# Patient Record
Sex: Male | Born: 1995 | Race: Black or African American | Hispanic: No | Marital: Single | Smoking: Never smoker
Health system: Southern US, Community
[De-identification: ages and names within clinical notes are randomized; demographics above are authoritative.]

## PROBLEM LIST (undated history)

## (undated) HISTORY — PX: NO PAST SURGERIES: SHX2092

---
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
## 2015-09-11 ENCOUNTER — Encounter: Payer: Self-pay | Admitting: Physical Therapy

## 2015-09-11 ENCOUNTER — Ambulatory Visit: Payer: BLUE CROSS/BLUE SHIELD | Attending: Family Medicine | Admitting: Physical Therapy

## 2015-09-11 DIAGNOSIS — M25552 Pain in left hip: Secondary | ICD-10-CM | POA: Diagnosis not present

## 2015-09-11 DIAGNOSIS — R262 Difficulty in walking, not elsewhere classified: Secondary | ICD-10-CM | POA: Insufficient documentation

## 2015-09-11 NOTE — Therapy (Signed)
[place] [facility] [address] [place], [place], [address] Phone: [phone]   Fax:  [phone]  Physical Therapy Evaluation  Patient Details  Name: Ryan Briggs MRN: [id number] Date of Birth: 1996-04-27 Referring Provider: Doristine MangoElizabeth White, NP  Encounter Date: 09/11/2015      PT End of Session - 09/11/15 1511    Visit Number 1   Number of Visits 6   Date for PT Re-Evaluation 09/25/15   PT Start Time 0943   PT Stop Time 1040   PT Time Calculation (min) 57 min   Activity Tolerance Patient tolerated treatment well;No increased pain   Behavior During Therapy [facility] for tasks assessed/performed      History reviewed. No pertinent past medical history.  History reviewed. No pertinent past surgical history.  There were no vitals filed for this visit.  Visit Diagnosis:  Pain in joint, pelvic region and thigh, left  Difficulty walking      Subjective Assessment - 09/11/15 1504    Subjective Pt reports L thigh pain with no mechanism of injury. Pt reports injury happening two months ago and that it has limited his basketball playing. Pt reports pain at rest at a 2/10 and while working at a 7/10. Pt states that he mostly has pain while working and once he stretches, it is better. Pt reports tightness around his knee but denies any joint line pain. Pt reports feeling pain with squatting.   Limitations Standing;Walking;Lifting   How long can you stand comfortably? 2 hours   How long can you walk comfortably? 1 hour   Diagnostic tests XRAYS   Patient Stated Goals return to work pain free/play basketball/squat without pain   Currently in Pain? Yes   Pain Score 2    Pain Location Leg   Pain Orientation Left   Pain Onset More than a month ago   Pain Frequency Intermittent            [facility] PT Assessment - 09/11/15 0001    Assessment   Medical Diagnosis L thigh pain   Referring Provider Doristine MangoElizabeth White, NP   Onset Date/Surgical  Date 06/15/15   Prior Therapy low back in middle school         There ex: Quad stretching 30 seconds x 3 holds. Pt reports relief of symptoms with this. Foam roller for L quad trigger point (good relief noted). Squatting to demonstrate proper form. Decreased pain with squatting after 3 rounds of lateral grade III patellar mobilizations. Pt educated on self patellar mobilizations.  See HEP handouts.         PT Education - 09/11/15 1509    Education provided Yes   Education Details Pt given stretching and foam roller to help decrease L thigh pain.   Person(s) Educated Patient   Methods Explanation;Demonstration;Handout   Comprehension Returned demonstration;Verbalized understanding             PT Long Term Goals - 09/11/15 1602    PT LONG TERM GOAL #1   Title Pt will improve LEFS to > 70/80 in order to stand at work for longer than one hour without increased pain.   Baseline 60/80 on 11/28   Time 3   Period Weeks   Status New   PT LONG TERM GOAL #2   Title Pt will return to playing basketball twice a week without increased complaints of L thigh pain.   Time 3   Period Weeks   Status New  PT LONG TERM GOAL #3   Title Pt will be independent with HEP in order to return to gym based workout routine.   Time 3   Period Weeks   Status New   PT LONG TERM GOAL #4   Title Pt will report walking tolerance for 2 hours in order to perform all work demands without increased complaints of pain.   Time 3   Period Weeks   Status New               Plan - 09/11/15 1511    Clinical Impression Statement Pt is a pleasant 19 y.o. referred to PT for L thigh pain with gradual onset over the past 2 months. Pt reports pain at rest at a 2/10 and while working at a 7/10. Pt's MMT B LEs is 5/5 with L knee extension pain. Pt's AROM is WNL and pain free. Pt has no quad leg present with SLR or quad set. Pt is (-) SLR for hamstring involvement. Pt's patellar mobility is hypermobile L as  compared to R side. Pt has increased symptoms with lateral patellar mobilizations but with repeated repetitions finds relief. The foam roller provides the most relief to L palpable trigger point in distal quad. Pt' LEFS: 60/80. Pt will benefit from short term skilled PT to address functional deficits and return to prior level of function.    Pt will benefit from skilled therapeutic intervention in order to improve on the following deficits Abnormal gait;Decreased activity tolerance;Decreased endurance;Hypermobility;Pain;Improper body mechanics;Difficulty walking   Rehab Potential Excellent   PT Frequency 2x / week   PT Duration 3 weeks   PT Treatment/Interventions ADLs/Self Care Home Management;Cryotherapy;Moist Heat;Balance training;Therapeutic exercise;Therapeutic activities;Manual techniques;Dry needling;Functional mobility training;Stair training;Gait training;Neuromuscular re-education;Patient/family education;Energy conservation   PT Next Visit Plan potentially dry needling/progress quad strengthening/squatting pain free   PT Home Exercise Plan see above   Consulted and Agree with Plan of Care Patient         Problem List There are no active problems to display for this patient.   Curlene Dolphin Higinio Grow,SPT  09/11/2015, 4:04 PM  [place] [facility] [address] [place], [place], [address] Phone: [phone]   Fax:  [phone]  Name: Ryan Briggs MRN: [id number] Date of Birth: [birth year]

## 2015-09-13 ENCOUNTER — Ambulatory Visit: Payer: BLUE CROSS/BLUE SHIELD | Admitting: Physical Therapy

## 2015-09-13 DIAGNOSIS — M25552 Pain in left hip: Secondary | ICD-10-CM

## 2015-09-13 DIAGNOSIS — R262 Difficulty in walking, not elsewhere classified: Secondary | ICD-10-CM

## 2015-09-13 NOTE — Therapy (Signed)
[place] [facility] [place] [address] [place], [place], [address] Phone: [phone]   Fax:  [phone]  Physical Therapy Treatment  Patient Details  Name: Ryan Briggs MRN: [id number] Date of Birth: Jun 24, 1996 Referring Provider: Doristine Mango, NP  Encounter Date: 09/13/2015      PT End of Session - 09/13/15 1231    Visit Number 2   Number of Visits 6   Date for PT Re-Evaluation 09/25/15   PT Start Time 1020   PT Stop Time 1105   PT Time Calculation (min) 45 min   Activity Tolerance Patient tolerated treatment well;No increased pain   Behavior During Therapy [facility] for tasks assessed/performed      No past medical history on file.  No past surgical history on file.  There were no vitals filed for this visit.  Visit Diagnosis:  Pain in joint, pelvic region and thigh, left  Difficulty walking      Subjective Assessment - 09/13/15 1229    Subjective Pt reports thigh feeling better but still having tightness behind his medial knee. Pt states he had pain after working yesterday for 45 mins that reached a 7/10. Pt reports after comimg home, his pain decreased to a 2/10.   Limitations Standing;Walking;Lifting   How long can you stand comfortably? 2 hours   How long can you walk comfortably? 1 hour   Diagnostic tests XRAYS   Patient Stated Goals return to work pain free/play basketball/squat without pain   Currently in Pain? No/denies      OBJECTIVE: Manual: L hamstring stretching in supine proximal and distal (increased complaints of tenderness and tightness with distal hamstring) 30 seconds x 4 each position. L distal quad STM to reassess tightness from last session. No palpable tenderness noted. In prone, strumming to L distal hamstring. Good relief noted and release of palpable trigger points. Pt reports feeling lose after strumming.There ex: Forwards walking lunges with the Nautilus 110# x 45. Good technique and no complaints of  increased symptoms. Side shuffle/sideways squat walking with 110# of Nautilus x 15 each direction. No reports of increased pain or symptoms. Step ups onto 12" step with knee hoovers and 5# dumbbells x 20.   Pt response to tx for medical necessity: Pt benefits from STM to maintain flexibility and then strengthen in a pain free range of motion.      PT Long Term Goals - 09/11/15 1602    PT LONG TERM GOAL #1   Title Pt will improve LEFS to > 70/80 in order to stand at work for longer than one hour without increased pain.   Baseline 60/80 on 11/28   Time 3   Period Weeks   Status New   PT LONG TERM GOAL #2   Title Pt will return to playing basketball twice a week without increased complaints of L thigh pain.   Time 3   Period Weeks   Status New   PT LONG TERM GOAL #3   Title Pt will be independent with HEP in order to return to gym based workout routine.   Time 3   Period Weeks   Status New   PT LONG TERM GOAL #4   Title Pt will report walking tolerance for 2 hours in order to perform all work demands without increased complaints of pain.   Time 3   Period Weeks   Status New               Plan -  09/13/15 1234    Clinical Impression Statement Pt has no tenderness to distal quad palpation and reports that stretching is making a difference. Pt is tender to palpation in distal vastus medialis in and right above the popliteal fossa. Pt finds relief with deep tissue strumming and is then able to perform strengthening. After strenghtening, pt has no increased tightness or complaints of pain.   Pt will benefit from skilled therapeutic intervention in order to improve on the following deficits Abnormal gait;Decreased activity tolerance;Decreased endurance;Hypermobility;Pain;Improper body mechanics;Difficulty walking   Rehab Potential Excellent   PT Frequency 2x / week   PT Duration 3 weeks   PT Treatment/Interventions ADLs/Self Care Home Management;Cryotherapy;Moist Heat;Balance  training;Therapeutic exercise;Therapeutic activities;Manual techniques;Dry needling;Functional mobility training;Stair training;Gait training;Neuromuscular re-education;Patient/family education;Energy conservation   PT Next Visit Plan progress strengthening/reasses body mechanics with gym routine/reasses distal hamstring tightness and length.    PT Home Exercise Plan see above   Consulted and Agree with Plan of Care Patient        Problem List There are no active problems to display for this patient.   Krista BlueMary Cameron Jaretzy Lhommedieu, SPT 09/13/2015, 1:29 PM  [place] [facility] [address] [place], [place], [address] Phone: [phone]   Fax:  [phone]  Name: Gwenlyn PerkingJoshua T Chirico MRN: [id number] Date of Birth: 25-Feb-1996

## 2015-09-18 ENCOUNTER — Encounter: Payer: BLUE CROSS/BLUE SHIELD | Admitting: Physical Therapy

## 2015-09-20 ENCOUNTER — Ambulatory Visit: Payer: BLUE CROSS/BLUE SHIELD | Attending: Family Medicine | Admitting: Physical Therapy

## 2015-09-20 DIAGNOSIS — R262 Difficulty in walking, not elsewhere classified: Secondary | ICD-10-CM | POA: Insufficient documentation

## 2015-09-20 DIAGNOSIS — M25552 Pain in left hip: Secondary | ICD-10-CM | POA: Insufficient documentation

## 2015-09-20 NOTE — Therapy (Addendum)
[place] [facility] [address] [place], [place], [address] Phone: [phone]   Fax:  [phone]  Physical Therapy Treatment  Patient Details  Name: Ryan Briggs MRN: [id number] Date of Birth: [birth year] Referring Provider: Doristine MangoElizabeth White, NP  Encounter Date: 09/20/2015    No past medical history on file.  No past surgical history on file.  There were no vitals filed for this visit.  Visit Diagnosis:  Pain in joint, pelvic region and thigh, left  Difficulty walking     OBJECTIVE: Manual: L hamstring stretching in supine proximal and distal (no tenderness). L distal quad STM to reassess tightness with no limitations noted.  No palpable tenderness noted.  Reassessed for trigger points with no pain noted.  Good muscle flexibility.  There.ex.:  Step ups/ downs 20x (no UE assist).  Plyometrics on 1st/2nd/3rd step (no limitations or pain reported). TM walking at 3mph with slow jog at 5mph (no issues).  150# sled push/pull with no issues.  Discussed HEP. Pt response to tx for medical necessity: Pt benefits from STM to maintain flexibility and then strengthen in a pain free range of motion. Discharge from PT at this time.           PT Long Term Goals - 09/21/15 1759    PT LONG TERM GOAL #1   Title Pt will improve LEFS to > 70/80 in order to stand at work for longer than one hour without increased pain.   Baseline 77 out of 80   Time 3   Period Weeks   Status Achieved   PT LONG TERM GOAL #2   Title Pt will return to playing basketball twice a week without increased complaints of L thigh pain.   Time 3   Period Weeks   Status Achieved   PT LONG TERM GOAL #3   Title Pt will be independent with HEP in order to return to gym based workout routine.   Time 3   Period Weeks   Status Achieved   PT LONG TERM GOAL #4   Title Pt will report walking tolerance for 2 hours in order to perform all work demands without increased  complaints of pain.   Time 3   Period Weeks   Status Achieved       Problem List There are no active problems to display for this patient.   Cammie McgeeSherk, Dejanae Helser C 09/25/2015, 10:31 AM  [place] [facility] [place] [address] [place], [place], [address] Phone: [phone]   Fax:  [phone]  Name: Ryan Briggs MRN: [id number] Date of Birth: [birth year]

## 2017-06-15 ENCOUNTER — Emergency Department
Admission: EM | Admit: 2017-06-15 | Discharge: 2017-06-16 | Disposition: A | Discharge status: Home / Self Care | Payer: BLUE CROSS/BLUE SHIELD | Attending: Emergency Medicine | Admitting: Emergency Medicine

## 2017-06-15 ENCOUNTER — Emergency Department: Payer: BLUE CROSS/BLUE SHIELD

## 2017-06-15 DIAGNOSIS — R55 Syncope and collapse: Secondary | ICD-10-CM

## 2017-06-15 LAB — CBC WITH DIFFERENTIAL/PLATELET
BASOS ABS: 0 10*3/uL (ref 0–0.1)
Basophils Relative: 0 %
EOS PCT: 1 %
Eosinophils Absolute: 0.1 10*3/uL (ref 0–0.7)
HCT: 37.8 % — ABNORMAL LOW (ref 40.0–52.0)
Hemoglobin: 13.2 g/dL (ref 13.0–18.0)
Lymphocytes Relative: 21 %
Lymphs Abs: 1.5 10*3/uL (ref 1.0–3.6)
MCH: 31.6 pg (ref 26.0–34.0)
MCHC: 35 g/dL (ref 32.0–36.0)
MCV: 90.3 fL (ref 80.0–100.0)
MONO ABS: 0.6 10*3/uL (ref 0.2–1.0)
MONOS PCT: 9 %
Neutro Abs: 5 10*3/uL (ref 1.4–6.5)
Neutrophils Relative %: 69 %
PLATELETS: 161 10*3/uL (ref 150–440)
RBC: 4.18 MIL/uL — ABNORMAL LOW (ref 4.40–5.90)
RDW: 12.9 % (ref 11.5–14.5)
WBC: 7.1 10*3/uL (ref 3.8–10.6)

## 2017-06-15 LAB — URINE DRUG SCREEN, QUALITATIVE (ARMC ONLY)
AMPHETAMINES, UR SCREEN: NOT DETECTED
BENZODIAZEPINE, UR SCRN: NOT DETECTED
Barbiturates, Ur Screen: NOT DETECTED
CANNABINOID 50 NG, UR ~~LOC~~: POSITIVE — AB
Cocaine Metabolite,Ur ~~LOC~~: NOT DETECTED
MDMA (Ecstasy)Ur Screen: NOT DETECTED
Methadone Scn, Ur: NOT DETECTED
OPIATE, UR SCREEN: NOT DETECTED
PHENCYCLIDINE (PCP) UR S: NOT DETECTED
Tricyclic, Ur Screen: NOT DETECTED

## 2017-06-15 LAB — COMPREHENSIVE METABOLIC PANEL
ALBUMIN: 4 g/dL (ref 3.5–5.0)
ALK PHOS: 35 U/L — AB (ref 38–126)
ALT: 19 U/L (ref 17–63)
ANION GAP: 8 (ref 5–15)
AST: 30 U/L (ref 15–41)
BUN: 15 mg/dL (ref 6–20)
CALCIUM: 8.7 mg/dL — AB (ref 8.9–10.3)
CO2: 25 mmol/L (ref 22–32)
CREATININE: 1.06 mg/dL (ref 0.61–1.24)
Chloride: 104 mmol/L (ref 101–111)
GFR calc Af Amer: >60 mL/min (ref >60)
GFR calc non Af Amer: >60 mL/min (ref >60)
GLUCOSE: 195 mg/dL — AB (ref 65–99)
Potassium: 3.7 mmol/L (ref 3.5–5.1)
SODIUM: 137 mmol/L (ref 135–145)
Total Bilirubin: 0.9 mg/dL (ref 0.3–1.2)
Total Protein: 6.4 g/dL — ABNORMAL LOW (ref 6.5–8.1)

## 2017-06-15 LAB — TROPONIN I: Troponin I: <0.03 ng/mL (ref <0.03)

## 2017-06-15 LAB — ETHANOL

## 2017-06-15 MED ORDER — SODIUM CHLORIDE 0.9 % IV SOLN
Freq: Once | INTRAVENOUS | Status: AC
  Administered 2017-06-15: 22:00:00 via INTRAVENOUS

## 2017-06-15 NOTE — ED Triage Notes (Signed)
Syncopal event in the kitchen. Unwitnessed but girlfriend found him on the floor. On arrival to ED patient alert and oriented but then started to tremble all over. No loss of bowel or bladder function.

## 2017-06-15 NOTE — ED Provider Notes (Signed)
[facility] Emergency Department Provider Note       Time seen: ----------------------------------------- 10:10 PM on 06/15/2017 -----------------------------------------     I have reviewed the triage vital signs and the nursing notes.   HISTORY   Chief Complaint Loss of Consciousness    HPI Ryan PerkingJoshua T Briggs is a 21 y.o. male who presents to the ED for syncope. Patient had an unwitnessed syncopal event where he was found in the floor by his girlfriend. On arrival he was alert and oriented but started to have tremors. He has not had loss of bowel or bladder function, was not incontinent, did not bite his tongue from the event. He is not complaining of any pain. Patient reports a syncopal event in the past which she was not evaluated for. Typically exercises without any near syncope or palpitations.   History reviewed. No pertinent past medical history.  There are no active problems to display for this patient.   History reviewed. No pertinent surgical history.  Allergies Patient has no known allergies.  Social History Social History  Substance Use Topics  . Smoking status: Not on file  . Smokeless tobacco: Not on file  . Alcohol use Not on file    Review of Systems Constitutional: Negative for fever. Eyes: Negative for vision changes ENT:  Negative for congestion, sore throat Cardiovascular: Negative for chest pain. Respiratory: Negative for shortness of breath. Gastrointestinal: Negative for abdominal pain, vomiting and diarrhea. Genitourinary: Negative for dysuria. Musculoskeletal: Negative for back pain. Skin: Negative for rash. Neurological: Negative for headaches, focal weakness or numbness.  All systems negative/normal/unremarkable except as stated in the HPI  ____________________________________________   PHYSICAL EXAM:  VITAL SIGNS: ED Triage Vitals  Enc Vitals Group     BP 06/15/17 2202 (!) 163/70     Pulse Rate 06/15/17  2202 84     Resp 06/15/17 2202 16     Temp 06/15/17 2202 98 F (36.7 C)     Temp Source 06/15/17 2202 Axillary     SpO2 06/15/17 2202 100 %     Weight 06/15/17 2204 115 lb (52.2 kg)     Height 06/15/17 2204 5\' 5"  (1.651 m)     Head Circumference --      Peak Flow --      Pain Score 06/15/17 2202 0     Pain Loc --      Pain Edu? --      Excl. in GC? --     Constitutional: Alert and oriented. Well appearing and in no distress. Eyes: Conjunctivae are normal. Normal extraocular movements. ENT   Head: Normocephalic and atraumatic.   Nose: No congestion/rhinnorhea.   Mouth/Throat: Mucous membranes are moist.   Neck: No stridor. Cardiovascular: Normal rate, regular rhythm. No murmurs, rubs, or gallops. Respiratory: Normal respiratory effort without tachypnea nor retractions. Breath sounds are clear and equal bilaterally. No wheezes/rales/rhonchi. Gastrointestinal: Soft and nontender. Normal bowel sounds Musculoskeletal: Nontender with normal range of motion in extremities. No lower extremity tenderness nor edema. Neurologic:  Normal speech and language. No gross focal neurologic deficits are appreciated.  Skin:  Skin is warm, dry and intact. No rash noted. Psychiatric: Mood and affect are normal. Speech and behavior are normal.  ____________________________________________  EKG: Interpreted by me. Sinus rhythm with rate 85 bpm, normal PR interval, normal QRS, normal QT.  ____________________________________________  ED COURSE:  Pertinent labs & imaging results that were available during my care of the patient were reviewed by me and considered  in my medical decision making (see chart for details). Patient presents for syncope, we will assess with labs and imaging as indicated.   Procedures ____________________________________________   LABS (pertinent positives/negatives)  Labs Reviewed  COMPREHENSIVE METABOLIC PANEL - Abnormal; Notable for the following:        Result Value   Glucose, Bld 195 (*)    Calcium 8.7 (*)    Total Protein 6.4 (*)    Alkaline Phosphatase 35 (*)    All other components within normal limits  CBC WITH DIFFERENTIAL/PLATELET - Abnormal; Notable for the following:    RBC 4.18 (*)    HCT 37.8 (*)    All other components within normal limits  ETHANOL  CBC WITH DIFFERENTIAL/PLATELET  URINE DRUG SCREEN, QUALITATIVE ([facility] ONLY)  TROPONIN I    RADIOLOGY Images were viewed by me  Chest x-ray IMPRESSION: Mild chronic hyperinflation. No acute abnormality. ____________________________________________  FINAL ASSESSMENT AND PLAN  Syncope  Plan: Patient's labs and imaging were dictated above. Patient had presented for Syncope of uncertain etiology. Final disposition is pending at this time.   [name], MD   Note: This note was generated in part or whole with voice recognition software. Voice recognition is usually quite accurate but there are transcription errors that can and very often do occur. I apologize for any typographical errors that were not detected and corrected.     [name], MD 06/15/17 [phone]

## 2017-06-16 NOTE — ED Provider Notes (Signed)
Care signed over from Dr. Mayford KnifeWilliams. Briefly the patient is a 21 year old man who comes to the emergency department after syncopal episode. He was smoking marijuana and then got up to get a drink of water. He began to feel nauseated and lightheaded in the next thing he knew he woke up on the ground. He has no family history of sudden cardiac death. His EKG is abnormal with an RSR pattern in V1 and V2 although he has no Brugada morphology. He has no arrhythmias and normal intervals. He was observed on monitor several hours here in the emergency department without ectopy. Strict return precautions given and he is discharged home in improved condition.   Merrily Brittleifenbark, Lamara Brecht, MD 06/16/17 Perlie Mayo0020

## 2017-06-16 NOTE — Discharge Instructions (Signed)
Please make an appointment to establish care with a primary care physician and return to the emergency department for any new or worsening symptoms such as if you pass out again, if he developed chest pain, or for any other issues whatsoever.  It was a pleasure to take care of you today, and thank you for coming to our emergency department.  If you have any questions or concerns before leaving please ask the nurse to grab me and I'm more than happy to go through your aftercare instructions again.  If you were prescribed any opioid pain medication today such as Norco, Vicodin, Percocet, morphine, hydrocodone, or oxycodone please make sure you do not drive when you are taking this medication as it can alter your ability to drive safely.  If you have any concerns once you are home that you are not improving or are in fact getting worse before you can make it to your follow-up appointment, please do not hesitate to call 911 and come back for further evaluation.  Merrily BrittleNeil Ana Woodroof, MD  Results for orders placed or performed during the hospital encounter of 06/15/17  Comprehensive metabolic panel  Result Value Ref Range   Sodium 137 135 - 145 mmol/L   Potassium 3.7 3.5 - 5.1 mmol/L   Chloride 104 101 - 111 mmol/L   CO2 25 22 - 32 mmol/L   Glucose, Bld 195 (H) 65 - 99 mg/dL   BUN 15 6 - 20 mg/dL   Creatinine, Ser 1.611.06 0.61 - 1.24 mg/dL   Calcium 8.7 (L) 8.9 - 10.3 mg/dL   Total Protein 6.4 (L) 6.5 - 8.1 g/dL   Albumin 4.0 3.5 - 5.0 g/dL   AST 30 15 - 41 U/L   ALT 19 17 - 63 U/L   Alkaline Phosphatase 35 (L) 38 - 126 U/L   Total Bilirubin 0.9 0.3 - 1.2 mg/dL   GFR calc non Af Amer >60 >60 mL/min   GFR calc Af Amer >60 >60 mL/min   Anion gap 8 5 - 15  Urine Drug Screen, Qualitative ([facility] only)  Result Value Ref Range   Tricyclic, Ur Screen NONE DETECTED NONE DETECTED   Amphetamines, Ur Screen NONE DETECTED NONE DETECTED   MDMA (Ecstasy)Ur Screen NONE DETECTED NONE DETECTED   Cocaine  Metabolite,Ur [place] NONE DETECTED NONE DETECTED   Opiate, Ur Screen NONE DETECTED NONE DETECTED   Phencyclidine (PCP) Ur S NONE DETECTED NONE DETECTED   Cannabinoid 50 Ng, Ur [place] POSITIVE (A) NONE DETECTED   Barbiturates, Ur Screen NONE DETECTED NONE DETECTED   Benzodiazepine, Ur Scrn NONE DETECTED NONE DETECTED   Methadone Scn, Ur NONE DETECTED NONE DETECTED  Ethanol  Result Value Ref Range   Alcohol, Ethyl (B) <5 <5 mg/dL  CBC with Differential/Platelet  Result Value Ref Range   WBC 7.1 3.8 - 10.6 K/uL   RBC 4.18 (L) 4.40 - 5.90 MIL/uL   Hemoglobin 13.2 13.0 - 18.0 g/dL   HCT 09.637.8 (L) 04.540.0 - 40.952.0 %   MCV 90.3 80.0 - 100.0 fL   MCH 31.6 26.0 - 34.0 pg   MCHC 35.0 32.0 - 36.0 g/dL   RDW 81.112.9 91.411.5 - 78.214.5 %   Platelets 161 150 - 440 K/uL   Neutrophils Relative % 69 %   Neutro Abs 5.0 1.4 - 6.5 K/uL   Lymphocytes Relative 21 %   Lymphs Abs 1.5 1.0 - 3.6 K/uL   Monocytes Relative 9 %   Monocytes Absolute 0.6 0.2 - 1.0 K/uL  Eosinophils Relative 1 %   Eosinophils Absolute 0.1 0 - 0.7 K/uL   Basophils Relative 0 %   Basophils Absolute 0.0 0 - 0.1 K/uL  Troponin I  Result Value Ref Range   Troponin I <0.03 <0.03 ng/mL   Dg Chest 2 View  Result Date: 06/15/2017 CLINICAL DATA:  Syncope in the kitchen. EXAM: CHEST  2 VIEW COMPARISON:  Radiograph 08/03/2008 FINDINGS: Mild hyperinflation which is chronic. The cardiomediastinal contours are normal. The lungs are clear. Pulmonary vasculature is normal. No consolidation, pleural effusion, or pneumothorax. No acute osseous abnormalities are seen. IMPRESSION: Mild chronic hyperinflation.  No acute abnormality. Electronically Signed   By: Rubye Oaks M.D.   On: 06/15/2017 23:04

## 2017-09-28 ENCOUNTER — Other Ambulatory Visit: Payer: Self-pay

## 2017-09-28 ENCOUNTER — Emergency Department: Payer: BLUE CROSS/BLUE SHIELD

## 2017-09-28 DIAGNOSIS — R000 Tachycardia, unspecified: Secondary | ICD-10-CM | POA: Insufficient documentation

## 2017-09-28 LAB — CBC
HEMATOCRIT: 41.7 % (ref 40.0–52.0)
HEMOGLOBIN: 14.2 g/dL (ref 13.0–18.0)
MCH: 31 pg (ref 26.0–34.0)
MCHC: 33.9 g/dL (ref 32.0–36.0)
MCV: 91.2 fL (ref 80.0–100.0)
Platelets: 213 10*3/uL (ref 150–440)
RBC: 4.58 MIL/uL (ref 4.40–5.90)
RDW: 13.1 % (ref 11.5–14.5)
WBC: 12.3 10*3/uL — ABNORMAL HIGH (ref 3.8–10.6)

## 2017-09-28 LAB — BASIC METABOLIC PANEL
ANION GAP: 10 (ref 5–15)
BUN: 21 mg/dL — ABNORMAL HIGH (ref 6–20)
CHLORIDE: 104 mmol/L (ref 101–111)
CO2: 24 mmol/L (ref 22–32)
Calcium: 9.1 mg/dL (ref 8.9–10.3)
Creatinine, Ser: 0.96 mg/dL (ref 0.61–1.24)
GFR calc non Af Amer: >60 mL/min (ref >60)
GLUCOSE: 229 mg/dL — AB (ref 65–99)
POTASSIUM: 3.3 mmol/L — AB (ref 3.5–5.1)
Sodium: 138 mmol/L (ref 135–145)

## 2017-09-28 LAB — TROPONIN I

## 2017-09-28 NOTE — ED Triage Notes (Signed)
Pt reports watching the football game and states that his heart started beating fast and he felt jittery. Pt states that he did smoke some weed and thinks it could be related to that

## 2017-09-29 ENCOUNTER — Emergency Department
Admission: EM | Admit: 2017-09-29 | Discharge: 2017-09-29 | Disposition: A | Discharge status: Home / Self Care | Payer: BLUE CROSS/BLUE SHIELD | Attending: Emergency Medicine | Admitting: Emergency Medicine

## 2017-09-29 ENCOUNTER — Encounter: Payer: Self-pay | Admitting: Emergency Medicine

## 2017-09-29 DIAGNOSIS — R000 Tachycardia, unspecified: Secondary | ICD-10-CM

## 2017-09-29 NOTE — Discharge Instructions (Signed)
Please follow up with your primary care physician.

## 2017-09-29 NOTE — ED Provider Notes (Signed)
[facility] Emergency Department Provider Note   ____________________________________________   First MD Initiated Contact with Patient 09/29/17 0216     (approximate)  I have reviewed the triage vital signs and the nursing notes.   HISTORY  Chief Complaint Tachycardia    HPI Ryan Briggs is a 21 y.o. male who comes into the hospital today with some heart racing.  The patient states that he was sitting watching a game on TV and his heart showed a racing.  He drinks some water and reports that it continued so he decided to come into the hospital.  The patient states it lasted about 10 minutes.  He denies any chest pain or shortness of breath.  Right before the episode the patient states he had smoked some marijuana.  He reports that he has smoked before and never had anything like this happen before.  He is here for evaluation.  History reviewed. No pertinent past medical history.  There are no active problems to display for this patient.   History reviewed. No pertinent surgical history.  Prior to Admission medications   Not on File    Allergies Patient has no known allergies.  No family history on file.  Social History Social History   Tobacco Use  . Smoking status: Never Smoker  Substance Use Topics  . Alcohol use: No    Frequency: Never  . Drug use: Yes    Types: Marijuana    Review of Systems  Constitutional: No fever/chills Eyes: No visual changes. ENT: No sore throat. Cardiovascular: Palpitations Respiratory: Denies shortness of breath. Gastrointestinal: No abdominal pain.  No nausea, no vomiting.  No diarrhea.  No constipation. Genitourinary: Negative for dysuria. Musculoskeletal: Negative for back pain. Skin: Negative for rash. Neurological: Negative for headaches, focal weakness or numbness.   ____________________________________________   PHYSICAL EXAM:  VITAL SIGNS: ED Triage Vitals  Enc Vitals Group     BP  09/28/17 2253 (!) 157/66     Pulse Rate 09/28/17 2253 98     Resp 09/28/17 2253 20     Temp 09/28/17 2253 98.6 F (37 C)     Temp Source 09/28/17 2253 Oral     SpO2 09/28/17 2253 100 %     Weight 09/28/17 2254 120 lb (54.4 kg)     Height 09/28/17 2254 5\' 5"  (1.651 m)     Head Circumference --      Peak Flow --      Pain Score --      Pain Loc --      Pain Edu? --      Excl. in GC? --     Constitutional: Alert and oriented. Well appearing and in no acute distress. Eyes: Conjunctivae are normal. PERRL. EOMI. Head: Atraumatic. Nose: No congestion/rhinnorhea. Mouth/Throat: Mucous membranes are moist.  Oropharynx non-erythematous. Cardiovascular: Normal rate, regular rhythm. Grossly normal heart sounds.  Good peripheral circulation. Respiratory: Normal respiratory effort.  No retractions. Lungs CTAB. Gastrointestinal: Soft and nontender. No distention.  Musculoskeletal: No lower extremity tenderness nor edema.   Neurologic:  Normal speech and language.  Skin:  Skin is warm, dry and intact.  Psychiatric: Mood and affect are normal.   ____________________________________________   LABS (all labs ordered are listed, but only abnormal results are displayed)  Labs Reviewed  BASIC METABOLIC PANEL - Abnormal; Notable for the following components:      Result Value   Potassium 3.3 (*)    Glucose, Bld 229 (*)  BUN 21 (*)    All other components within normal limits  CBC - Abnormal; Notable for the following components:   WBC 12.3 (*)    All other components within normal limits  TROPONIN I   ____________________________________________  EKG  ED ECG REPORT I, Rebecka ApleyWebster,  Blanchie Zeleznik P, the attending physician, personally viewed and interpreted this ECG.   Date: 09/28/2017  EKG Time: 2246  Rate: 112  Rhythm: sinus tachycardia  Axis: normal  Intervals:none  ST&T Change: none  ____________________________________________  RADIOLOGY  Dg Chest 2 View  Result Date:  09/28/2017 CLINICAL DATA:  Tachycardia EXAM: CHEST  2 VIEW COMPARISON:  June 15, 2017 FINDINGS: There is no edema or consolidation. The heart size and pulmonary vascularity are normal. No adenopathy. No pneumothorax. No bone lesions. IMPRESSION: No edema or consolidation. Electronically Signed   By: Bretta BangWilliam  Woodruff III M.D.   On: 09/28/2017 23:16    ____________________________________________   PROCEDURES  Procedure(s) performed: None  Procedures  Critical Care performed: No  ____________________________________________   INITIAL IMPRESSION / ASSESSMENT AND PLAN / ED COURSE  As part of my medical decision making, I reviewed the following data within the electronic MEDICAL RECORD NUMBER Notes from prior ED visits and [place] Controlled Substance Database   This is a 21 year old male who comes into the hospital today with palpitations.  He initially came in with some tachycardia but it did improve without any intervention.  I feel that the patient's tachycardia was due to his marijuana usage.  The patient will be discharged home as his blood work is unremarkable.      ____________________________________________   FINAL CLINICAL IMPRESSION(S) / ED DIAGNOSES  Final diagnoses:  Tachycardia     ED Discharge Orders    None       Note:  This document was prepared using Dragon voice recognition software and may include unintentional dictation errors.    Rebecka ApleyWebster, Maritta Kief P, MD 09/29/17 [phone]

## 2018-07-25 IMAGING — CR DG CHEST 2V
1 series · 2 of 2 positions shown · non-contrast
Comparison: June 15, 2017

CLINICAL DATA: Tachycardia

EXAM:
CHEST  2 VIEW

[Series 1: dg chest 2 view · 0.14mm/px · 2 of 2 slices shown]
[im 1/2]
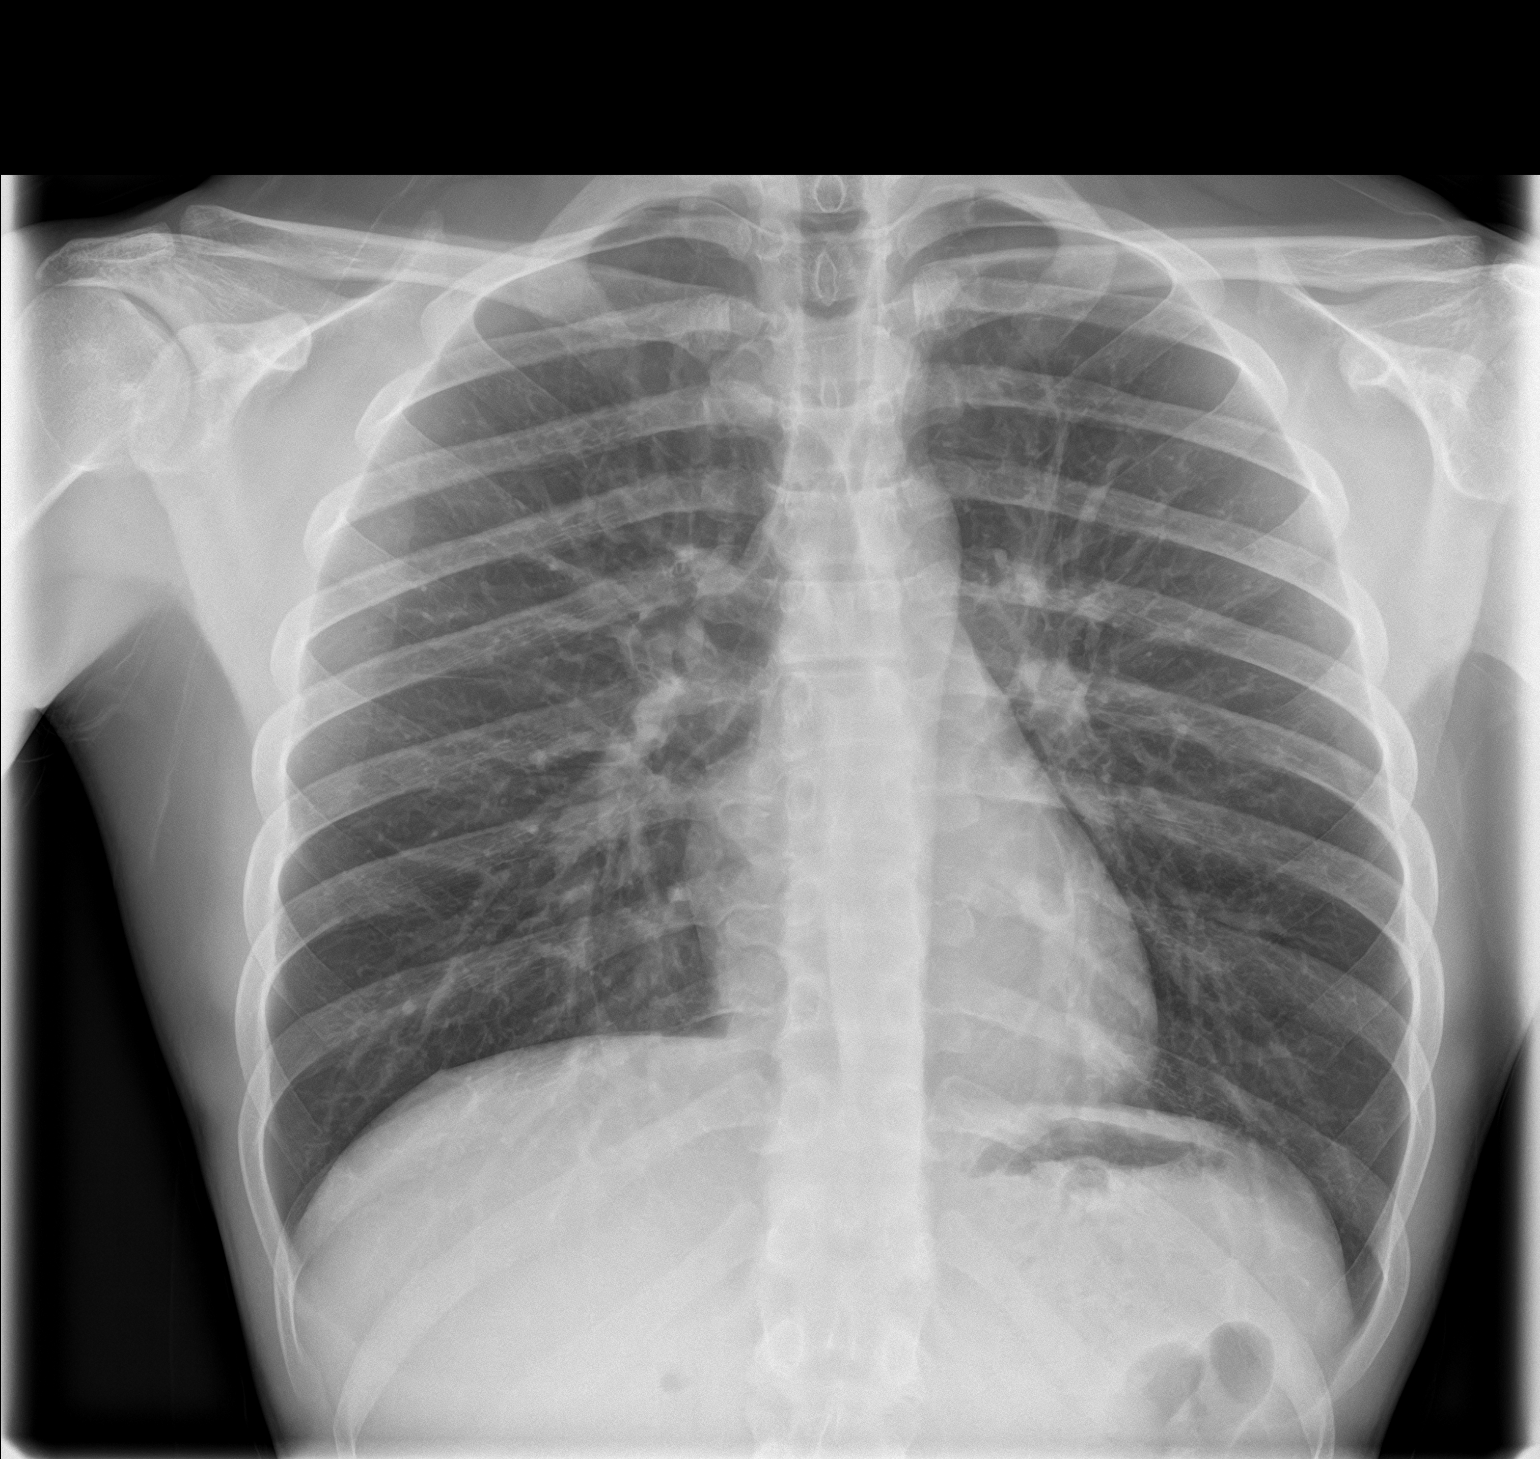
[im 2/2]
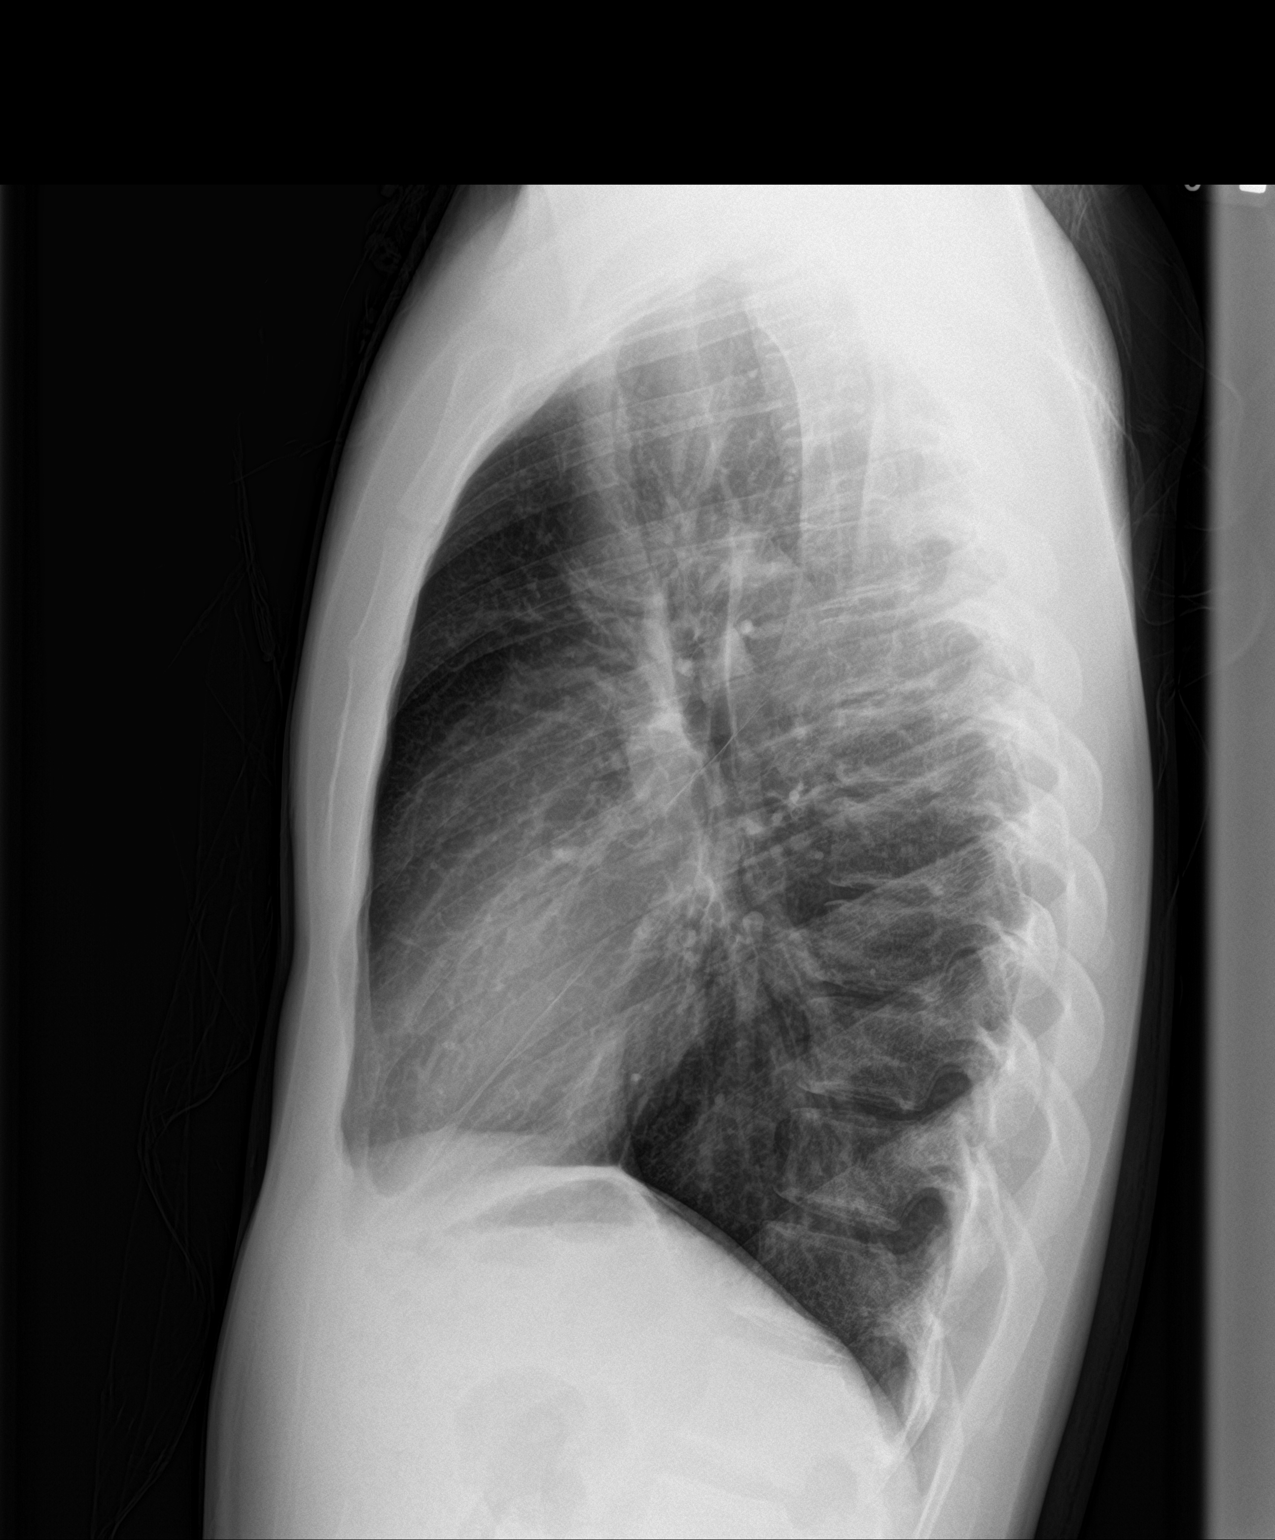

[2 of 2 positions shown; findings below may reference images not displayed]

FINDINGS: There is no edema or consolidation. The heart size and pulmonary
vascularity are normal. No adenopathy. No pneumothorax. No bone
lesions.
IMPRESSION: No edema or consolidation.

## 2018-10-23 ENCOUNTER — Other Ambulatory Visit: Payer: Self-pay

## 2018-10-23 ENCOUNTER — Ambulatory Visit: Admission: EM | Admit: 2018-10-23 | Discharge: 2018-10-23 | Discharge status: Absent without leave | Payer: Self-pay

## 2018-10-23 ENCOUNTER — Ambulatory Visit
Admission: EM | Admit: 2018-10-23 | Discharge: 2018-10-23 | Disposition: A | Discharge status: Home / Self Care | Payer: BLUE CROSS/BLUE SHIELD | Attending: Family Medicine | Admitting: Family Medicine

## 2018-10-23 DIAGNOSIS — W450XXA Nail entering through skin, initial encounter: Secondary | ICD-10-CM

## 2018-10-23 DIAGNOSIS — Z23 Encounter for immunization: Secondary | ICD-10-CM

## 2018-10-23 DIAGNOSIS — S61232A Puncture wound without foreign body of right middle finger without damage to nail, initial encounter: Secondary | ICD-10-CM

## 2018-10-23 DIAGNOSIS — T148XXA Other injury of unspecified body region, initial encounter: Secondary | ICD-10-CM | POA: Insufficient documentation

## 2018-10-23 MED ORDER — TETANUS-DIPHTH-ACELL PERTUSSIS 5-2.5-18.5 LF-MCG/0.5 IM SUSP
0.5000 mL | Freq: Once | INTRAMUSCULAR | Status: AC
  Administered 2018-10-23: 0.5 mL via INTRAMUSCULAR

## 2018-10-23 NOTE — ED Triage Notes (Signed)
Patient states that he was building a deck and a nail went through his middle right finger. Wound has closed over now but patient states that last Tdap was 8 years ago.

## 2018-10-23 NOTE — Discharge Instructions (Signed)
Tetanus given.  No need to worry.  Take care  Dr. Adriana Simas

## 2018-10-23 NOTE — ED Provider Notes (Signed)
[facility]    CSN: [id number] Arrival date & time: 10/23/18  1139  History   Chief Complaint Chief Complaint  Patient presents with  . Puncture Wound   HPI  23 year old male presents with a puncture wound.  Patient states that he was building a porch with his father.  He was carrying some wood and inadvertently punctured his right middle finger with a nail.  Wound is healing well.  No current bleeding or drainage.  Patient's tetanus is not up-to-date.  His last one was 8 years ago.  Patient is requesting a tetanus shot today.  No pain at this time.  No other associated symptoms.  No other complaints.  PMH: Hx of syncope.  Past Surgical History:  Procedure Laterality Date  . NO PAST SURGERIES     Home Medications    Prior to Admission medications   Not on File   Family History Family History  Problem Relation Age of Onset  . Diabetes Mother   . Multiple sclerosis Mother   . Diabetes Father   . Multiple sclerosis Father    Social History Social History   Tobacco Use  . Smoking status: Never Smoker  . Smokeless tobacco: Never Used  Substance Use Topics  . Alcohol use: Yes    Frequency: Never    Comment: rare  . Drug use: Not Currently    Types: Marijuana   Allergies   Patient has no known allergies.   Review of Systems Review of Systems  Constitutional: Negative.   Skin: Positive for wound.   Physical Exam Triage Vital Signs ED Triage Vitals  Enc Vitals Group     BP 10/23/18 1151 128/81     Pulse Rate 10/23/18 1151 (!) 54     Resp 10/23/18 1151 15     Temp 10/23/18 1151 98.2 F (36.8 C)     Temp Source 10/23/18 1151 Oral     SpO2 10/23/18 1151 100 %     Weight 10/23/18 1149 125 lb (56.7 kg)     Height 10/23/18 1149 5\' 5"  (1.651 m)     Head Circumference --      Peak Flow --      Pain Score 10/23/18 1148 2     Pain Loc --      Pain Edu? --      Excl. in GC? --    Updated Vital Signs BP 128/81 (BP Location: Right Arm)   Pulse  (!) 54   Temp 98.2 F (36.8 C) (Oral)   Resp 15   Ht 5\' 5"  (1.651 m)   Wt 56.7 kg   SpO2 100%   BMI 20.80 kg/m   Visual Acuity Right Eye Distance:   Left Eye Distance:   Bilateral Distance:    Right Eye Near:   Left Eye Near:    Bilateral Near:     Physical Exam Vitals signs and nursing note reviewed.  Constitutional:      General: He is not in acute distress. HENT:     Head: Normocephalic and atraumatic.  Eyes:     General: No scleral icterus.    Conjunctiva/sclera: Conjunctivae normal.  Cardiovascular:     Rate and Rhythm: Regular rhythm. Bradycardia present.  Pulmonary:     Effort: Pulmonary effort is normal.     Breath sounds: No wheezing, rhonchi or rales.  Skin:    Comments: Right middle finger -puncture wound is healing well.  No bleeding.  No drainage.  No erythema.  Neurological:     Mental Status: He is alert.  Psychiatric:        Mood and Affect: Mood normal.        Behavior: Behavior normal.    UC Treatments / Results  Labs (all labs ordered are listed, but only abnormal results are displayed) Labs Reviewed - No data to display  EKG None  Radiology No results found.  Procedures Procedures (including critical care time)  Medications Ordered in UC Medications  Tdap (BOOSTRIX) injection 0.5 mL (0.5 mLs Intramuscular Given 10/23/18 1155)    Initial Impression / Assessment and Plan / UC Course  I have reviewed the triage vital signs and the nursing notes.  Pertinent labs & imaging results that were available during my care of the patient were reviewed by me and considered in my medical decision making (see chart for details).    23 year old male presents with a puncture wound. Tetanus given today.  Final Clinical Impressions(s) / UC Diagnoses   Final diagnoses:  Puncture wound     Discharge Instructions     Tetanus given.  No need to worry.  Take care  Dr. Adriana Simasook    ED Prescriptions    None     Controlled Substance  Prescriptions [place] Controlled Substance Registry consulted? Not Applicable   Tommie SamsCook, Callie Facey G, DO 10/23/18 1208

## 2021-10-30 ENCOUNTER — Ambulatory Visit: Payer: Self-pay

## 2023-12-22 ENCOUNTER — Other Ambulatory Visit: Payer: Self-pay

## 2023-12-22 ENCOUNTER — Emergency Department: Payer: Self-pay

## 2023-12-22 ENCOUNTER — Emergency Department
Admission: EM | Admit: 2023-12-22 | Discharge: 2023-12-22 | Disposition: A | Discharge status: Home / Self Care | Payer: Self-pay | Attending: Emergency Medicine | Admitting: Emergency Medicine

## 2023-12-22 DIAGNOSIS — S39012A Strain of muscle, fascia and tendon of lower back, initial encounter: Secondary | ICD-10-CM | POA: Diagnosis not present

## 2023-12-22 DIAGNOSIS — S161XXA Strain of muscle, fascia and tendon at neck level, initial encounter: Secondary | ICD-10-CM | POA: Diagnosis not present

## 2023-12-22 DIAGNOSIS — S3992XA Unspecified injury of lower back, initial encounter: Secondary | ICD-10-CM | POA: Diagnosis present

## 2023-12-22 DIAGNOSIS — M25562 Pain in left knee: Secondary | ICD-10-CM | POA: Diagnosis not present

## 2023-12-22 DIAGNOSIS — Y92410 Unspecified street and highway as the place of occurrence of the external cause: Secondary | ICD-10-CM | POA: Insufficient documentation

## 2023-12-22 MED ORDER — CYCLOBENZAPRINE HCL 5 MG PO TABS: 5.0000 mg | ORAL_TABLET | Freq: Three times a day (TID) | ORAL | 0 refills | Status: AC | PRN
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
## 2024-02-11 ENCOUNTER — Other Ambulatory Visit: Payer: Self-pay | Admitting: Pain Medicine

## 2024-02-11 DIAGNOSIS — M47816 Spondylosis without myelopathy or radiculopathy, lumbar region: Secondary | ICD-10-CM

## 2024-02-11 DIAGNOSIS — M47812 Spondylosis without myelopathy or radiculopathy, cervical region: Secondary | ICD-10-CM

## 2024-02-20 ENCOUNTER — Ambulatory Visit
Admission: RE | Admit: 2024-02-20 | Discharge: 2024-02-20 | Disposition: A | Discharge status: Home / Self Care | Payer: Self-pay | Source: Ambulatory Visit | Attending: Pain Medicine | Admitting: Pain Medicine

## 2024-02-20 DIAGNOSIS — M47816 Spondylosis without myelopathy or radiculopathy, lumbar region: Secondary | ICD-10-CM

## 2024-02-20 DIAGNOSIS — M47812 Spondylosis without myelopathy or radiculopathy, cervical region: Secondary | ICD-10-CM
